# Patient Record
Sex: Female | Born: 1960 | Race: White | Hispanic: Yes | Marital: Married
Health system: Southern US, Community
[De-identification: ages and names within clinical notes are randomized; demographics above are authoritative.]

## PROBLEM LIST (undated history)

## (undated) ENCOUNTER — Emergency Department: Payer: BC Managed Care – PPO

---

## 1998-08-20 ENCOUNTER — Other Ambulatory Visit: Admission: RE | Admit: 1998-08-20 | Discharge: 1998-08-20 | Payer: Self-pay | Admitting: Obstetrics and Gynecology

## 1999-07-02 ENCOUNTER — Other Ambulatory Visit: Admission: RE | Admit: 1999-07-02 | Discharge: 1999-07-02 | Payer: Self-pay | Admitting: Obstetrics and Gynecology

## 2000-07-07 ENCOUNTER — Other Ambulatory Visit: Admission: RE | Admit: 2000-07-07 | Discharge: 2000-07-07 | Payer: Self-pay | Admitting: Obstetrics and Gynecology

## 2000-09-01 HISTORY — PX: AUGMENTATION MAMMAPLASTY: SUR837

## 2001-07-16 ENCOUNTER — Other Ambulatory Visit: Admission: RE | Admit: 2001-07-16 | Discharge: 2001-07-16 | Payer: Self-pay | Admitting: Obstetrics and Gynecology

## 2002-08-05 ENCOUNTER — Other Ambulatory Visit: Admission: RE | Admit: 2002-08-05 | Discharge: 2002-08-05 | Payer: Self-pay | Admitting: Obstetrics and Gynecology

## 2003-06-24 ENCOUNTER — Other Ambulatory Visit: Admission: RE | Admit: 2003-06-24 | Discharge: 2003-06-24 | Payer: Self-pay | Admitting: Obstetrics and Gynecology

## 2004-07-13 ENCOUNTER — Other Ambulatory Visit: Admission: RE | Admit: 2004-07-13 | Discharge: 2004-07-13 | Payer: Self-pay | Admitting: Obstetrics and Gynecology

## 2004-08-10 ENCOUNTER — Encounter: Admission: RE | Admit: 2004-08-10 | Discharge: 2004-08-10 | Payer: Self-pay | Admitting: Obstetrics and Gynecology

## 2005-06-30 ENCOUNTER — Other Ambulatory Visit: Admission: RE | Admit: 2005-06-30 | Discharge: 2005-06-30 | Payer: Self-pay | Admitting: Obstetrics and Gynecology

## 2005-09-06 ENCOUNTER — Encounter: Admission: RE | Admit: 2005-09-06 | Discharge: 2005-09-06 | Payer: Self-pay | Admitting: Obstetrics and Gynecology

## 2006-03-24 ENCOUNTER — Ambulatory Visit (HOSPITAL_COMMUNITY): Admission: RE | Admit: 2006-03-24 | Discharge: 2006-03-24 | Payer: Self-pay | Admitting: Obstetrics and Gynecology

## 2006-03-24 ENCOUNTER — Encounter (INDEPENDENT_AMBULATORY_CARE_PROVIDER_SITE_OTHER): Payer: Self-pay | Admitting: Specialist

## 2006-09-07 ENCOUNTER — Encounter: Admission: RE | Admit: 2006-09-07 | Discharge: 2006-09-07 | Payer: Self-pay | Admitting: Obstetrics and Gynecology

## 2007-09-13 ENCOUNTER — Encounter: Admission: RE | Admit: 2007-09-13 | Discharge: 2007-09-13 | Payer: Self-pay | Admitting: Obstetrics and Gynecology

## 2008-09-16 ENCOUNTER — Encounter: Admission: RE | Admit: 2008-09-16 | Discharge: 2008-09-16 | Payer: Self-pay | Admitting: Obstetrics and Gynecology

## 2009-09-17 ENCOUNTER — Encounter: Admission: RE | Admit: 2009-09-17 | Discharge: 2009-09-17 | Payer: Self-pay | Admitting: Obstetrics and Gynecology

## 2010-08-23 ENCOUNTER — Other Ambulatory Visit: Payer: Self-pay | Admitting: Obstetrics and Gynecology

## 2010-08-23 DIAGNOSIS — Z1239 Encounter for other screening for malignant neoplasm of breast: Secondary | ICD-10-CM

## 2010-09-20 ENCOUNTER — Ambulatory Visit: Payer: Self-pay

## 2010-09-21 ENCOUNTER — Ambulatory Visit
Admission: RE | Admit: 2010-09-21 | Discharge: 2010-09-21 | Disposition: A | Discharge status: Home / Self Care | Payer: No Typology Code available for payment source | Source: Ambulatory Visit | Attending: Obstetrics and Gynecology | Admitting: Obstetrics and Gynecology

## 2010-09-21 DIAGNOSIS — Z1239 Encounter for other screening for malignant neoplasm of breast: Secondary | ICD-10-CM

## 2010-12-17 NOTE — Op Note (Signed)
NAME:  Meagan Mcintyre, Meagan Mcintyre           ACCOUNT NO.:  [id number]   MEDICAL RECORD NO.:  [id number]          PATIENT TYPE:  AMB   LOCATION:  [facility]C                           FACILITY:  [facility]   PHYSICIAN:  Zenaida Niece, M.D.DATE OF BIRTH:  01-28-1961   DATE OF PROCEDURE:  03/24/2006  DATE OF DISCHARGE:                                 OPERATIVE REPORT   PREOPERATIVE DIAGNOSES:  Menorrhagia.   POSTOPERATIVE DIAGNOSIS:  Menorrhagia.   PROCEDURE:  Hysteroscopy with dilation and curettage and endometrial  ablation with NovaSure.   SURGEON:  Zenaida Niece, M.D.   ANESTHESIA:  IV sedation and paracervical block.   SPECIMENS:  Endometrial curettings.   ESTIMATED BLOOD LOSS:  Minimal.   COMPLICATIONS:  None.   FINDINGS:  She had a normal uterine cavity with a uterine depth of 9.5 cm  and a cervical length of 4.5 cm, making a uterine length of 5 cm, and with  the NovaSure device, the uterine width was 4 cm and the NovaSure device used  110 watts for 96 seconds.   PROCEDURE IN DETAIL:  The patient was taken to the operating room and placed  in the dorsal supine position.  She was given IV sedation and placed in  mobile stirrups.  Perineum was then prepped and draped in the usual sterile  fashion and bladder drained with a red rubber catheter.  A Grave's speculum  was inserted into the vagina, and 2 mL of plain lidocaine was used in the  anterior cervix.  The anterior cervix was then grasped with a single-tooth  tenaculum.  Deep paracervical block was then performed using 16 mL of 2%  plain lidocaine.  The cervix was then initially difficult to dilate.  However, I was able to get the size 13 dilator through and measure the  uterine depth at 9 cm.  Cervix was then easily dilated to a size 17 dilator.  The observer hysteroscope was inserted, and good visualization was achieved.  There was a fairly atrophic cavity with minimal tissue, and both tubal ostia  were identified.  The hysteroscope  was removed, and the cervix was measured  at 4.5 cm.  The cervix was then gradually dilated to accommodate the size 7  Hegar dilator.  The NovaSure device was then inserted and deployed  appropriately.  The CO2 test was passed, and endometrial ablation was  performed without complications.  The  NovaSure device was then removed and found to be intact.  The single-tooth  tenaculum was removed from the cervix and bleeding controlled with pressure.  All instruments were then removed from the vagina.  The patient tolerated  the procedure well and was taken to the recovery room in stable condition.  Counts were correct.      Zenaida Niece, M.D.  Electronically Signed     TDM/MEDQ  D:  03/24/2006  T:  03/24/2006  Job:  [id number]

## 2010-12-17 NOTE — H&P (Signed)
NAME:  Meagan Mcintyre, Meagan Mcintyre           ACCOUNT NO.:  [id number]   MEDICAL RECORD NO.:  [id number]          PATIENT TYPE:  AMB   LOCATION:  SDC                           FACILITY:  [facility]   PHYSICIAN:  Zenaida Niece, M.D.DATE OF BIRTH:  [birth year]   DATE OF ADMISSION:  DATE OF DISCHARGE:                                HISTORY & PHYSICAL   CHIEF COMPLAINT:  Abnormal uterine bleeding/menorrhagia.   HISTORY OF PRESENT ILLNESS:  This is a 50 year old white female para 3-0-0-  3, whom I first saw on August 8 of this year.  The patient has had regular  periods prior to having a Mirena approximately two months ago, but her  periods were heavy for 3-4 days and then light for 3-4 days.  She had the  Mirena placed, again, approximately two months ago to try and help this, but  it was removed approximately two weeks prior to me seeing her due to  constant bleeding.  The patient reports that she had a normal pelvic  ultrasound a few months ago.  Due to her persistent menorrhagia, the patient  wishes to proceed with endometrial ablation.   PAST OB HISTORY:  Three vaginal deliveries at term without complications.   PAST MEDICAL HISTORY:  The patient denies.   PAST SURGICAL HISTORY:  Breast augmentation.   ALLERGIES:  SULFA.   CURRENT MEDICATIONS:  None.   GYN HISTORY:  No history of abnormal Pap smears with the patient reporting  her last Pap smear in November 2006.   FAMILY HISTORY:  No GYN or colon cancer.   REVIEW OF SYSTEMS:  Normal bowel and bladder function.   SOCIAL HISTORY:  The patient is married to [place].  Denies alcohol,  tobacco, or drug history.   PHYSICAL EXAMINATION:  VITAL SIGNS:  Weight 115 pounds, blood pressure 90/60.  GENERAL:  This is a well developed female in no acute distress.  NECK:  Supple without lymphadenopathy or thyromegaly.  LUNGS:  Clear to auscultation.  HEART:  Regular rate and rhythm without murmur.  ABDOMEN:  Soft, nontender, nondistended,  without palpable masses.  EXTREMITIES:  No edema and are nontender.  PELVIC:  External genitalia has no lesions.  On speculum exam, she has a  normal cervix which was stenotic to the point where I was unable to pass a  uterine sound.  On bimanual exam, she has a small anteverted nontender  uterus, no adnexal masses.   ASSESSMENT:  Persistent menorrhagia despite treatment with Mirena IUD.  The  options have been discussed with the patient and she wishes to proceed with  endometrial ablation.  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  The risks of surgery have  been discussed and she understands.   PLAN:  Admit the patient on the day of surgery for a dilation and curettage  with hysteroscopy followed  by endometrial ablation with NovaSure.  She will  be treated preoperatively with Cytotec to soften the cervix.      Zenaida Niece, M.D.  Electronically Signed     TDM/MEDQ  D:  03/23/2006  T:  03/23/2006  Job:  [id number]

## 2011-09-09 ENCOUNTER — Other Ambulatory Visit: Payer: Self-pay | Admitting: Obstetrics and Gynecology

## 2011-09-09 DIAGNOSIS — Z1231 Encounter for screening mammogram for malignant neoplasm of breast: Secondary | ICD-10-CM

## 2011-09-23 ENCOUNTER — Ambulatory Visit
Admission: RE | Admit: 2011-09-23 | Discharge: 2011-09-23 | Disposition: A | Discharge status: Home / Self Care | Payer: PRIVATE HEALTH INSURANCE | Source: Ambulatory Visit | Attending: Obstetrics and Gynecology | Admitting: Obstetrics and Gynecology

## 2011-09-23 DIAGNOSIS — Z1231 Encounter for screening mammogram for malignant neoplasm of breast: Secondary | ICD-10-CM

## 2012-09-11 ENCOUNTER — Other Ambulatory Visit: Payer: Self-pay | Admitting: Obstetrics and Gynecology

## 2012-09-11 DIAGNOSIS — Z1231 Encounter for screening mammogram for malignant neoplasm of breast: Secondary | ICD-10-CM

## 2012-09-27 ENCOUNTER — Ambulatory Visit
Admission: RE | Admit: 2012-09-27 | Discharge: 2012-09-27 | Disposition: A | Discharge status: Home / Self Care | Payer: BC Managed Care – PPO | Source: Ambulatory Visit | Attending: Obstetrics and Gynecology | Admitting: Obstetrics and Gynecology

## 2012-09-27 DIAGNOSIS — Z1231 Encounter for screening mammogram for malignant neoplasm of breast: Secondary | ICD-10-CM

## 2013-08-26 ENCOUNTER — Other Ambulatory Visit: Payer: Self-pay

## 2013-08-26 DIAGNOSIS — Z1231 Encounter for screening mammogram for malignant neoplasm of breast: Secondary | ICD-10-CM

## 2013-09-27 ENCOUNTER — Ambulatory Visit
Admission: RE | Admit: 2013-09-27 | Discharge: 2013-09-27 | Disposition: A | Discharge status: Home / Self Care | Payer: BC Managed Care – PPO | Source: Ambulatory Visit | Attending: Obstetrics & Gynecology | Admitting: Obstetrics & Gynecology

## 2013-09-27 ENCOUNTER — Other Ambulatory Visit: Payer: Self-pay | Admitting: Obstetrics and Gynecology

## 2013-09-27 ENCOUNTER — Other Ambulatory Visit: Payer: Self-pay | Admitting: Obstetrics & Gynecology

## 2013-09-27 DIAGNOSIS — N63 Unspecified lump in unspecified breast: Secondary | ICD-10-CM

## 2014-08-20 ENCOUNTER — Other Ambulatory Visit: Payer: Self-pay

## 2014-08-20 DIAGNOSIS — Z1231 Encounter for screening mammogram for malignant neoplasm of breast: Secondary | ICD-10-CM

## 2014-09-29 ENCOUNTER — Ambulatory Visit
Admission: RE | Admit: 2014-09-29 | Discharge: 2014-09-29 | Disposition: A | Discharge status: Home / Self Care | Payer: BLUE CROSS/BLUE SHIELD | Source: Ambulatory Visit

## 2014-09-29 DIAGNOSIS — Z1231 Encounter for screening mammogram for malignant neoplasm of breast: Secondary | ICD-10-CM

## 2015-07-03 ENCOUNTER — Other Ambulatory Visit (HOSPITAL_COMMUNITY): Payer: Self-pay | Admitting: Obstetrics and Gynecology

## 2015-07-03 ENCOUNTER — Ambulatory Visit (HOSPITAL_COMMUNITY)
Admission: RE | Admit: 2015-07-03 | Discharge: 2015-07-03 | Disposition: A | Discharge status: Home / Self Care | Payer: BLUE CROSS/BLUE SHIELD | Source: Ambulatory Visit | Attending: Obstetrics and Gynecology | Admitting: Obstetrics and Gynecology

## 2015-07-03 DIAGNOSIS — R042 Hemoptysis: Secondary | ICD-10-CM

## 2015-09-08 ENCOUNTER — Other Ambulatory Visit: Payer: Self-pay

## 2015-09-08 DIAGNOSIS — Z1231 Encounter for screening mammogram for malignant neoplasm of breast: Secondary | ICD-10-CM

## 2015-09-30 ENCOUNTER — Ambulatory Visit
Admission: RE | Admit: 2015-09-30 | Discharge: 2015-09-30 | Disposition: A | Discharge status: Home / Self Care | Payer: BLUE CROSS/BLUE SHIELD | Source: Ambulatory Visit

## 2015-09-30 DIAGNOSIS — Z1231 Encounter for screening mammogram for malignant neoplasm of breast: Secondary | ICD-10-CM

## 2015-11-02 ENCOUNTER — Other Ambulatory Visit: Payer: Self-pay | Admitting: Obstetrics and Gynecology

## 2015-11-04 LAB — CYTOLOGY - PAP

## 2016-09-28 ENCOUNTER — Other Ambulatory Visit: Payer: Self-pay | Admitting: Obstetrics and Gynecology

## 2016-09-28 DIAGNOSIS — Z1231 Encounter for screening mammogram for malignant neoplasm of breast: Secondary | ICD-10-CM

## 2016-10-17 ENCOUNTER — Ambulatory Visit
Admission: RE | Admit: 2016-10-17 | Discharge: 2016-10-17 | Disposition: A | Discharge status: Home / Self Care | Payer: BLUE CROSS/BLUE SHIELD | Source: Ambulatory Visit | Attending: Obstetrics and Gynecology | Admitting: Obstetrics and Gynecology

## 2016-10-17 DIAGNOSIS — Z1231 Encounter for screening mammogram for malignant neoplasm of breast: Secondary | ICD-10-CM

## 2017-11-09 ENCOUNTER — Other Ambulatory Visit: Payer: Self-pay | Admitting: Obstetrics and Gynecology

## 2017-11-09 DIAGNOSIS — Z1231 Encounter for screening mammogram for malignant neoplasm of breast: Secondary | ICD-10-CM

## 2017-12-07 ENCOUNTER — Ambulatory Visit: Payer: BLUE CROSS/BLUE SHIELD

## 2018-06-03 IMAGING — MG DIGITAL SCREENING BILATERAL MAMMOGRAM WITH IMPLANTS AND CAD
9 series · 9 of 9 positions shown · non-contrast
Comparison: Previous exam(s).

CLINICAL DATA: Screening.

EXAM:
DIGITAL SCREENING BILATERAL MAMMOGRAM WITH IMPLANTS AND CAD
The patient has retropectoral implants. Standard and implant
displaced views were performed.

[L MLO (1 of 3)]
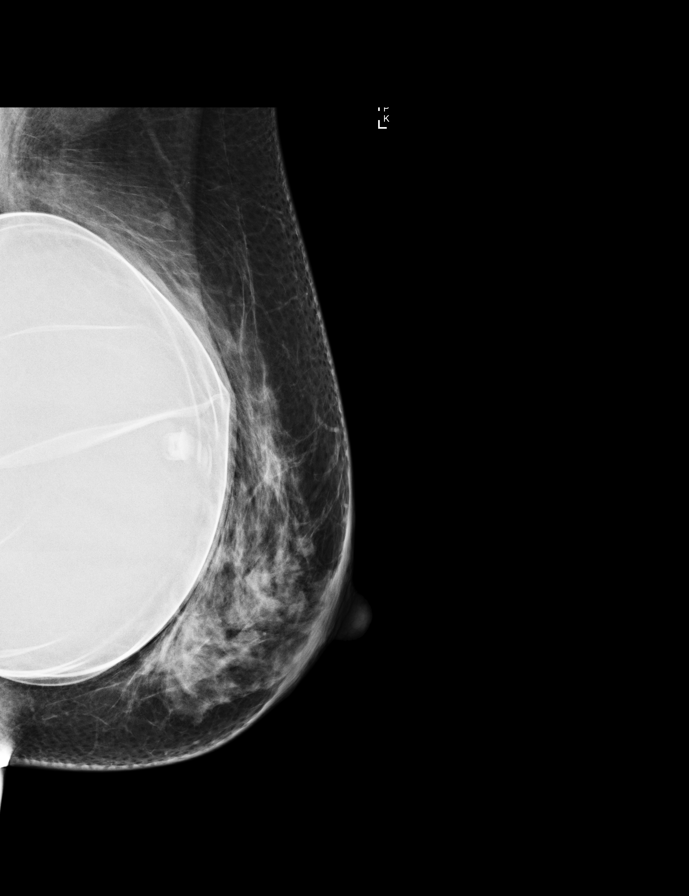

[L MLO (2 of 3)]
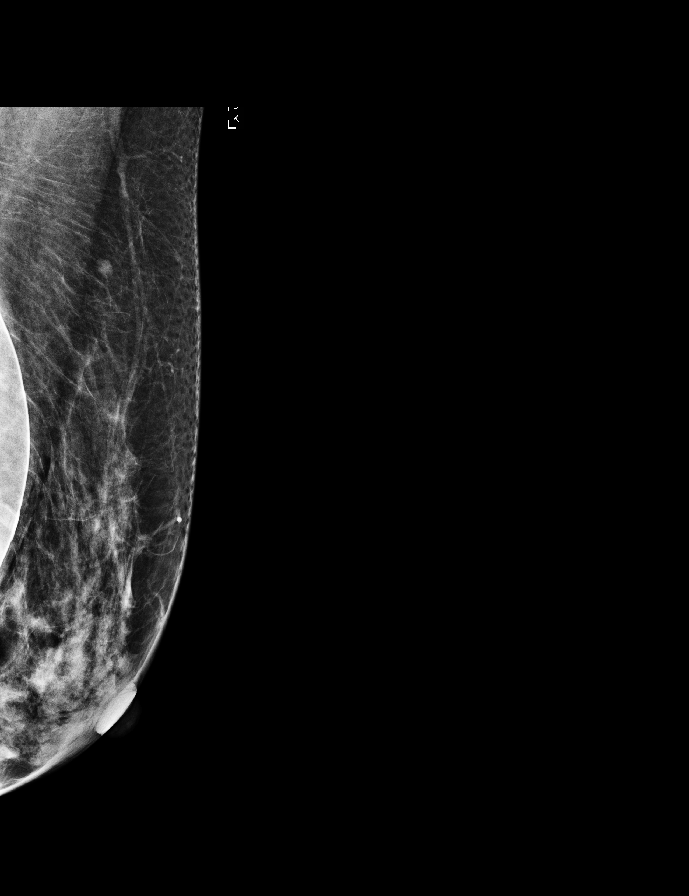

[R CC (1 of 2)]
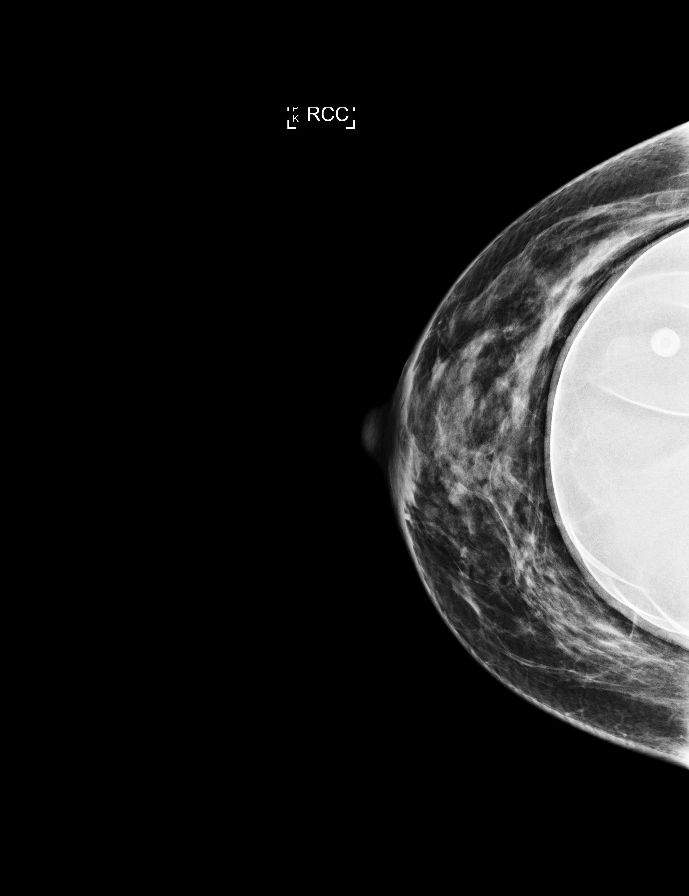

[R MLO (1 of 2)]
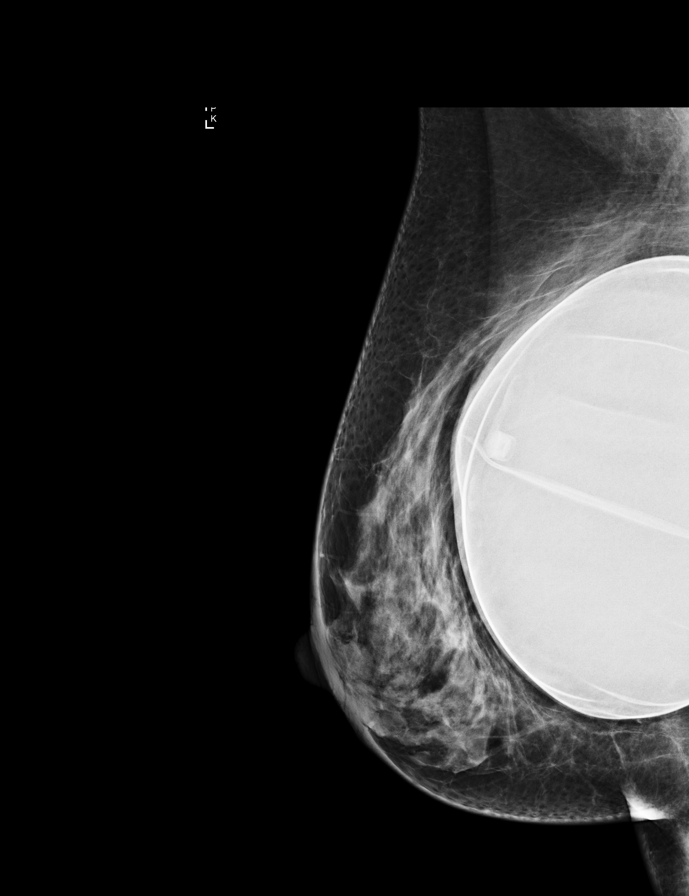

[L CC (1 of 2)]
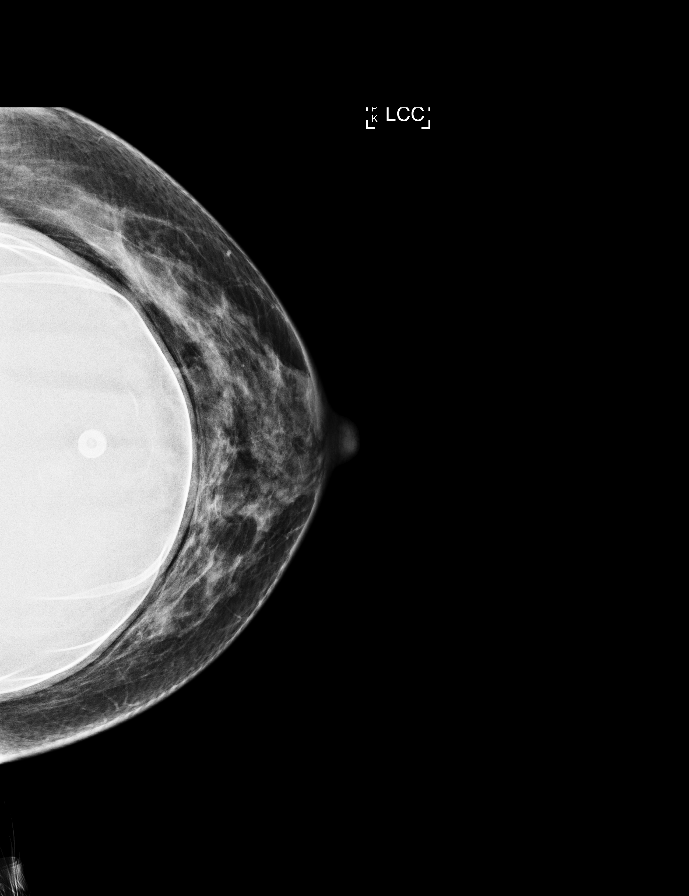

[R MLO (2 of 2)]
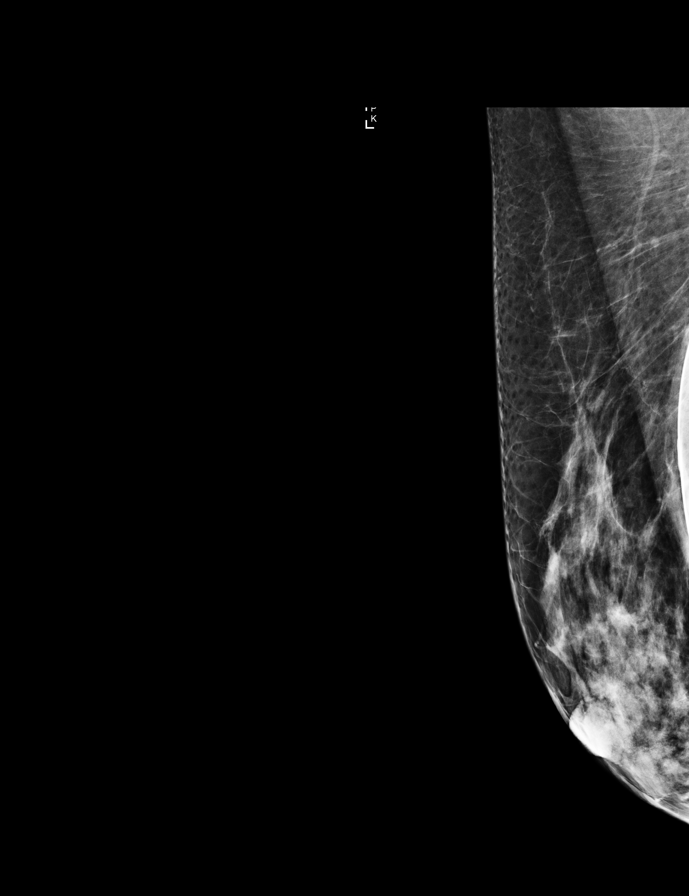

[R CC (2 of 2)]
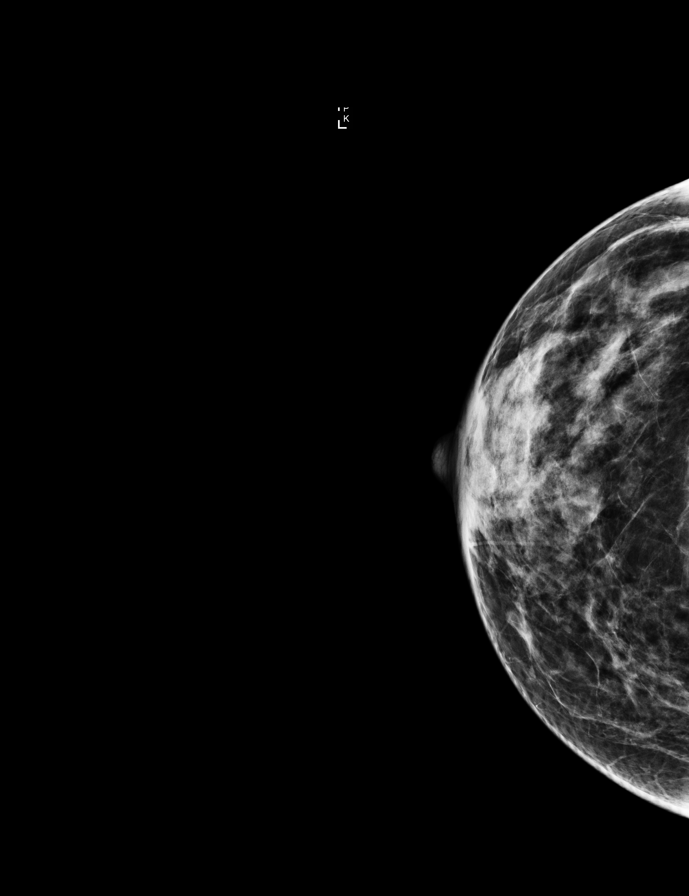

[L CC (2 of 2)]
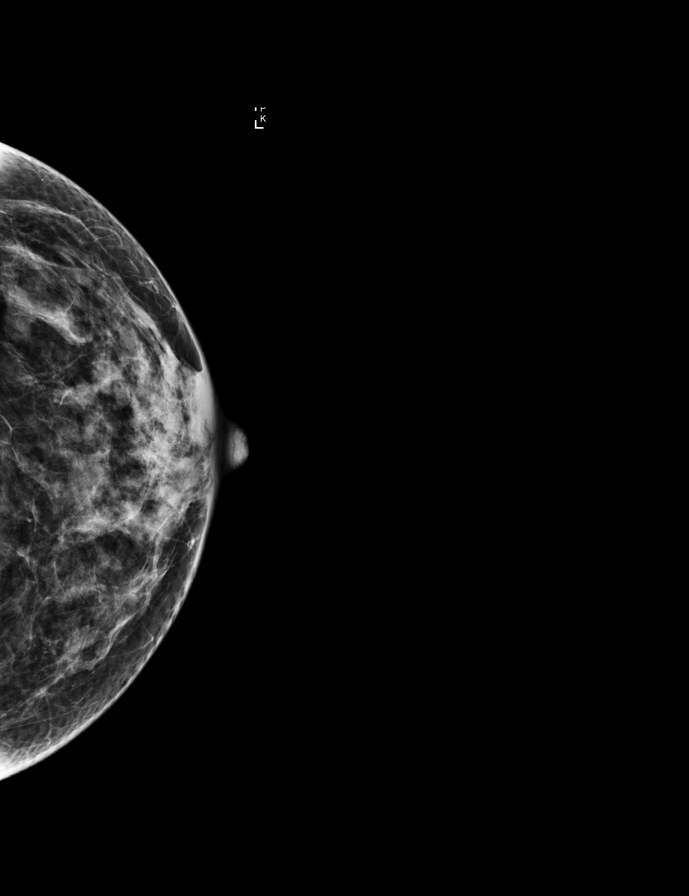

[L MLO (3 of 3)]
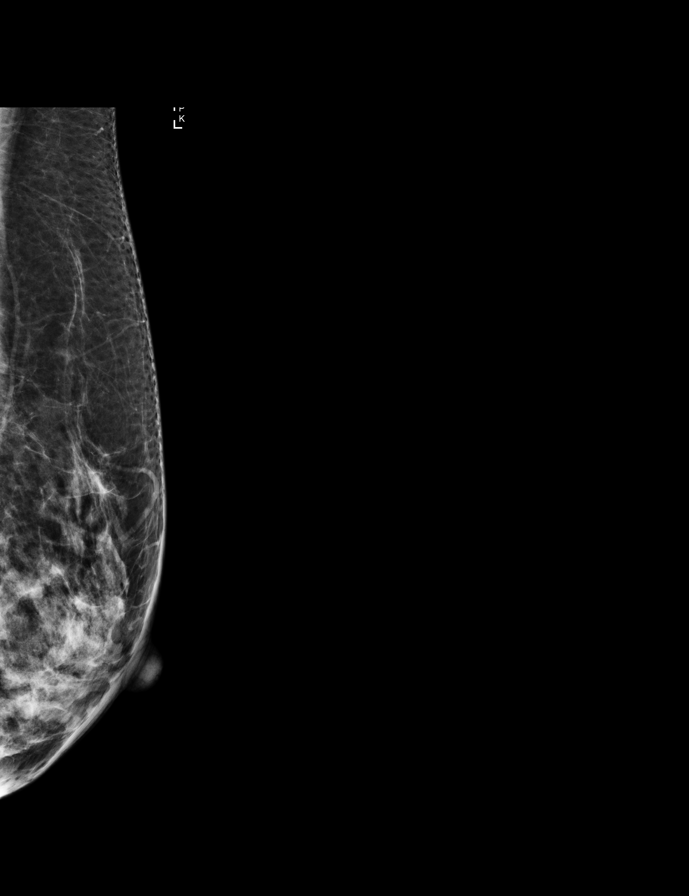

[9 of 9 positions shown; findings below may reference images not displayed]

ACR Breast Density Category c: The breast tissue is heterogeneously
dense, which may obscure small masses.
FINDINGS: There are no findings suspicious for malignancy. Images were
processed with CAD.
IMPRESSION: No mammographic evidence of malignancy. A result letter of this
screening mammogram will be mailed directly to the patient.

RECOMMENDATION:
Screening mammogram in one year. (Code:R9-8-RFM)

BI-RADS CATEGORY  1:  Negative.

## 2019-06-05 ENCOUNTER — Other Ambulatory Visit: Payer: Self-pay

## 2019-06-05 DIAGNOSIS — Z20822 Contact with and (suspected) exposure to covid-19: Secondary | ICD-10-CM

## 2019-06-06 LAB — NOVEL CORONAVIRUS, NAA: SARS-CoV-2, NAA: NOT DETECTED

## 2019-08-21 ENCOUNTER — Ambulatory Visit: Payer: BC Managed Care – PPO | Attending: Internal Medicine

## 2019-08-21 DIAGNOSIS — Z20822 Contact with and (suspected) exposure to covid-19: Secondary | ICD-10-CM

## 2019-08-22 LAB — NOVEL CORONAVIRUS, NAA: SARS-CoV-2, NAA: NOT DETECTED

## 2021-01-12 ENCOUNTER — Other Ambulatory Visit: Payer: Self-pay | Admitting: Obstetrics and Gynecology

## 2021-01-12 LAB — SARS CORONAVIRUS 2 (TAT 6-24 HRS): SARS Coronavirus 2: NEGATIVE

## 2024-09-02 ENCOUNTER — Other Ambulatory Visit: Payer: Self-pay | Admitting: Obstetrics and Gynecology

## 2024-09-02 DIAGNOSIS — R92333 Mammographic heterogeneous density, bilateral breasts: Secondary | ICD-10-CM

## 2024-09-29 ENCOUNTER — Ambulatory Visit
